# Patient Record
Sex: Female | Born: 1990 | Race: White | Hispanic: No | Marital: Single | Smoking: Never smoker
Health system: Southern US, Community
[De-identification: ages and names within clinical notes are randomized; demographics above are authoritative.]

## PROBLEM LIST (undated history)

## (undated) DIAGNOSIS — E282 Polycystic ovarian syndrome: Secondary | ICD-10-CM

## (undated) DIAGNOSIS — N200 Calculus of kidney: Secondary | ICD-10-CM

## (undated) HISTORY — PX: HAND SURGERY: SHX662

---

## 2013-02-24 ENCOUNTER — Emergency Department (HOSPITAL_BASED_OUTPATIENT_CLINIC_OR_DEPARTMENT_OTHER)
Admission: EM | Admit: 2013-02-24 | Discharge: 2013-02-24 | Disposition: A | Discharge status: Home / Self Care | Payer: Managed Care, Other (non HMO) | Attending: Emergency Medicine | Admitting: Emergency Medicine

## 2013-02-24 ENCOUNTER — Encounter (HOSPITAL_BASED_OUTPATIENT_CLINIC_OR_DEPARTMENT_OTHER): Payer: Self-pay | Admitting: Emergency Medicine

## 2013-02-24 DIAGNOSIS — Z3490 Encounter for supervision of normal pregnancy, unspecified, unspecified trimester: Secondary | ICD-10-CM

## 2013-02-24 DIAGNOSIS — N390 Urinary tract infection, site not specified: Secondary | ICD-10-CM | POA: Insufficient documentation

## 2013-02-24 DIAGNOSIS — Z87442 Personal history of urinary calculi: Secondary | ICD-10-CM | POA: Insufficient documentation

## 2013-02-24 DIAGNOSIS — Z79899 Other long term (current) drug therapy: Secondary | ICD-10-CM | POA: Insufficient documentation

## 2013-02-24 DIAGNOSIS — O2390 Unspecified genitourinary tract infection in pregnancy, unspecified trimester: Secondary | ICD-10-CM | POA: Insufficient documentation

## 2013-02-24 HISTORY — DX: Calculus of kidney: N20.0

## 2013-02-24 HISTORY — DX: Polycystic ovarian syndrome: E28.2

## 2013-02-24 LAB — WET PREP, GENITAL
Trich, Wet Prep: NONE SEEN
Yeast Wet Prep HPF POC: NONE SEEN

## 2013-02-24 LAB — HCG, QUANTITATIVE, PREGNANCY: hCG, Beta Chain, Quant, S: 16738 m[IU]/mL — ABNORMAL HIGH (ref <5)

## 2013-02-24 LAB — URINALYSIS, ROUTINE W REFLEX MICROSCOPIC
Bilirubin Urine: NEGATIVE
Glucose, UA: NEGATIVE mg/dL
Hgb urine dipstick: NEGATIVE
Specific Gravity, Urine: 1.021 (ref 1.005–1.030)

## 2013-02-24 LAB — URINE MICROSCOPIC-ADD ON

## 2013-02-24 MED ORDER — CITRANATAL RX 27-1 MG PO TABS: 1.0000 | ORAL_TABLET | Freq: Once | ORAL | Status: AC

## 2013-02-24 MED ORDER — CEPHALEXIN 500 MG PO CAPS: 500.0000 mg | ORAL_CAPSULE | Freq: Four times a day (QID) | ORAL | Status: DC

## 2013-02-24 NOTE — ED Provider Notes (Signed)
CSN: [id number]     Arrival date & time 02/24/13  1942 History   First MD Initiated Contact with Patient 02/24/13 1957     Chief Complaint  Patient presents with  . Flank Pain   (Consider location/radiation/quality/duration/timing/severity/associated sxs/prior Treatment) Patient is a 22 y.o. female presenting with cramps. The history is provided by the patient. No language interpreter was used.  Abdominal Cramping This is a new problem. Episode onset: 3 days. The problem occurs constantly. The problem has been unchanged. Associated symptoms include abdominal pain. Nothing aggravates the symptoms. She has tried nothing for the symptoms. The treatment provided moderate relief.    Past Medical History  Diagnosis Date  . Kidney stone   . Polycystic ovary disease    Past Surgical History  Procedure Laterality Date  . Hand surgery     No family history on file. History  Substance Use Topics  . Smoking status: Never Smoker   . Smokeless tobacco: Not on file  . Alcohol Use: Yes   OB History   Grav Para Term Preterm Abortions TAB SAB Ect Mult Living                 Review of Systems  Unable to perform ROS Gastrointestinal: Positive for abdominal pain.  Genitourinary: Positive for pelvic pain.    Allergies  Penicillins and Sulfa antibiotics  Home Medications   Current Outpatient Rx  Name  Route  Sig  Dispense  Refill  . metFORMIN (GLUCOPHAGE) 1000 MG tablet   Oral   Take 1,000 mg by mouth daily.          BP 123/69  Pulse 90  Temp(Src) 98.6 F (37 C) (Oral)  Resp 18  Ht 5' (1.524 m)  Wt 145 lb (65.772 kg)  BMI 28.32 kg/m2  SpO2 97% Physical Exam  Nursing note and vitals reviewed. Constitutional: She is oriented to person, place, and time. She appears well-developed and well-nourished.  HENT:  Head: Normocephalic and atraumatic.  Eyes: Conjunctivae and EOM are normal. Pupils are equal, round, and reactive to light.  Neck: Normal range of motion.   Pulmonary/Chest: Effort normal.  Abdominal: Soft. She exhibits no distension.  Genitourinary: Vagina normal. No vaginal discharge found.  Uterus enlarged  Musculoskeletal: Normal range of motion.  Neurological: She is alert and oriented to person, place, and time.  Skin: Skin is warm.  Psychiatric: She has a normal mood and affect.    ED Course  Procedures (including critical care time) Labs Review Labs Reviewed  PREGNANCY, URINE - Abnormal; Notable for the following:    Preg Test, Ur POSITIVE (*)    All other components within normal limits  URINALYSIS, ROUTINE W REFLEX MICROSCOPIC - Abnormal; Notable for the following:    APPearance CLOUDY (*)    Nitrite POSITIVE (*)    Leukocytes, UA SMALL (*)    All other components within normal limits  URINE MICROSCOPIC-ADD ON - Abnormal; Notable for the following:    Bacteria, UA FEW (*)    All other components within normal limits  URINE CULTURE  HCG, QUANTITATIVE, PREGNANCY   Imaging Review No results found.  EKG Interpretation   None       MDM   1. Pregnancy   2. UTI (lower urinary tract infection)    Informal ultrasound  IUP, good heart rate 150'.  Baby active,    Formal ultrasound scheduled   Elson Areas, PA-C 02/24/13 2139

## 2013-02-24 NOTE — ED Notes (Signed)
Pt c/o left flank pain starting today. Pt c/o pelvic pain x 2-3 days. Hx of kidney stones.

## 2013-02-24 NOTE — ED Notes (Signed)
Denies abnormal vaginal discharge.

## 2013-02-25 ENCOUNTER — Other Ambulatory Visit (HOSPITAL_BASED_OUTPATIENT_CLINIC_OR_DEPARTMENT_OTHER): Payer: Self-pay | Admitting: Physician Assistant

## 2013-02-25 ENCOUNTER — Ambulatory Visit (HOSPITAL_BASED_OUTPATIENT_CLINIC_OR_DEPARTMENT_OTHER)
Admission: RE | Admit: 2013-02-25 | Discharge: 2013-02-25 | Disposition: A | Discharge status: Home / Self Care | Payer: Managed Care, Other (non HMO) | Source: Ambulatory Visit | Attending: Family Medicine | Admitting: Family Medicine

## 2013-02-25 DIAGNOSIS — O2390 Unspecified genitourinary tract infection in pregnancy, unspecified trimester: Secondary | ICD-10-CM | POA: Diagnosis not present

## 2013-02-25 DIAGNOSIS — N390 Urinary tract infection, site not specified: Secondary | ICD-10-CM | POA: Diagnosis not present

## 2013-02-25 DIAGNOSIS — Z3490 Encounter for supervision of normal pregnancy, unspecified, unspecified trimester: Secondary | ICD-10-CM

## 2013-02-25 DIAGNOSIS — R109 Unspecified abdominal pain: Secondary | ICD-10-CM | POA: Insufficient documentation

## 2013-02-25 DIAGNOSIS — O9989 Other specified diseases and conditions complicating pregnancy, childbirth and the puerperium: Secondary | ICD-10-CM | POA: Insufficient documentation

## 2013-02-25 LAB — GC/CHLAMYDIA PROBE AMP: GC Probe RNA: NEGATIVE

## 2013-02-25 LAB — URINE CULTURE: Colony Count: >=100000

## 2013-02-25 NOTE — ED Provider Notes (Signed)
Medical screening examination/treatment/procedure(s) were performed by non-physician practitioner and as supervising physician I was immediately available for consultation/collaboration.  EKG Interpretation   None          Kimyetta Flott J. Savon Cobbs, MD 02/25/13 2136 

## 2013-03-25 ENCOUNTER — Encounter (HOSPITAL_BASED_OUTPATIENT_CLINIC_OR_DEPARTMENT_OTHER): Payer: Self-pay | Admitting: Emergency Medicine

## 2013-03-25 ENCOUNTER — Emergency Department (HOSPITAL_BASED_OUTPATIENT_CLINIC_OR_DEPARTMENT_OTHER)
Admission: EM | Admit: 2013-03-25 | Discharge: 2013-03-25 | Disposition: A | Discharge status: Home / Self Care | Payer: Managed Care, Other (non HMO) | Attending: Emergency Medicine | Admitting: Emergency Medicine

## 2013-03-25 DIAGNOSIS — Z87442 Personal history of urinary calculi: Secondary | ICD-10-CM | POA: Insufficient documentation

## 2013-03-25 DIAGNOSIS — E282 Polycystic ovarian syndrome: Secondary | ICD-10-CM | POA: Diagnosis not present

## 2013-03-25 DIAGNOSIS — N61 Mastitis without abscess: Secondary | ICD-10-CM

## 2013-03-25 DIAGNOSIS — O91119 Abscess of breast associated with pregnancy, unspecified trimester: Secondary | ICD-10-CM | POA: Insufficient documentation

## 2013-03-25 DIAGNOSIS — O9989 Other specified diseases and conditions complicating pregnancy, childbirth and the puerperium: Secondary | ICD-10-CM | POA: Diagnosis present

## 2013-03-25 DIAGNOSIS — Z79899 Other long term (current) drug therapy: Secondary | ICD-10-CM | POA: Insufficient documentation

## 2013-03-25 DIAGNOSIS — Z880 Allergy status to penicillin: Secondary | ICD-10-CM | POA: Diagnosis not present

## 2013-03-25 MED ORDER — CLINDAMYCIN HCL 150 MG PO CAPS: 300.0000 mg | ORAL_CAPSULE | Freq: Three times a day (TID) | ORAL | Status: DC

## 2013-03-25 NOTE — ED Provider Notes (Signed)
CSN: [id number]     Arrival date & time 03/25/13  2049 History   First MD Initiated Contact with Patient 03/25/13 2109     Chief Complaint  Patient presents with  . Breast Pain   (Consider location/radiation/quality/duration/timing/severity/associated sxs/prior Treatment) HPI Comments: Patient is a 23 year old G1P0 female who is [redacted]w[redacted]d pregnant who presents with a 1 week history of right breast pain. Symptoms started gradually and progressively worsened since the onset. The pain is aching and moderate without radiation. Patient reports associated redness, warmth and tenderness to palpation. Palpation of the area makes the pain worse. No alleviating factors. Patient denies nipple discharge and any other associated symptoms.    Past Medical History  Diagnosis Date  . Kidney stone   . Polycystic ovary disease    Past Surgical History  Procedure Laterality Date  . Hand surgery     History reviewed. No pertinent family history. History  Substance Use Topics  . Smoking status: Never Smoker   . Smokeless tobacco: Not on file  . Alcohol Use: Yes   OB History   Grav Para Term Preterm Abortions TAB SAB Ect Mult Living                 Review of Systems  Constitutional: Negative for fever, chills and fatigue.  HENT: Negative for trouble swallowing.   Eyes: Negative for visual disturbance.  Respiratory: Negative for shortness of breath.   Cardiovascular: Negative for chest pain and palpitations.  Gastrointestinal: Negative for nausea, vomiting, abdominal pain and diarrhea.  Genitourinary: Negative for dysuria and difficulty urinating.  Musculoskeletal: Negative for arthralgias and neck pain.  Skin: Positive for color change and rash.  Neurological: Negative for dizziness and weakness.  Psychiatric/Behavioral: Negative for dysphoric mood.    Allergies  Penicillins and Sulfa antibiotics  Home Medications   Current Outpatient Rx  Name  Route  Sig  Dispense  Refill  . cephALEXin  (KEFLEX) 500 MG capsule   Oral   Take 1 capsule (500 mg total) by mouth 4 (four) times daily.   40 capsule   0   . metFORMIN (GLUCOPHAGE) 1000 MG tablet   Oral   Take 1,000 mg by mouth daily.         . Prenat w/o A-FeCb-FeGl-DSS-FA (CITRANATAL RX) 27-1 MG TABS   Oral   Take 1 tablet by mouth once.   30 each   3    BP 135/79  Pulse 104  Temp(Src) 98.2 F (36.8 C) (Oral)  Resp 16  Ht 5\' 1"  (1.549 m)  Wt 164 lb 9 oz (74.645 kg)  BMI 31.11 kg/m2  SpO2 100% Physical Exam  Nursing note and vitals reviewed. Constitutional: She is oriented to person, place, and time. She appears well-developed and well-nourished. No distress.  HENT:  Head: Normocephalic and atraumatic.  Eyes: Conjunctivae are normal.  Neck: Normal range of motion.  Cardiovascular: Normal rate and regular rhythm.  Exam reveals no gallop and no friction rub.   No murmur heard. Pulmonary/Chest: Effort normal and breath sounds normal. She has no wheezes. She has no rales. She exhibits no tenderness.  Musculoskeletal: Normal range of motion.  Neurological: She is alert and oriented to person, place, and time. Coordination normal.  Speech is goal-oriented. Moves limbs without ataxia.   Skin: Skin is warm and dry.  Right inner breast quadrants erythematous, warm and tender to palpate. No fluctuance of the skin. The skin is indurated. No nipple discharge.   Psychiatric: She has a  normal mood and affect. Her behavior is normal.    ED Course  Procedures (including critical care time) Labs Review Labs Reviewed - No data to display Imaging Review No results found.  EKG Interpretation   None       MDM   1. Cellulitis of breast     9:26 PM Patient likely has breast cellulitis. Vitals stable and patient afebrile. Patient will be treated with Clindamycin. Patient instructed to return to the ED with worsening or concerning symptoms.    Emilia BeckKaitlyn Aloysious Vangieson, PA-C 03/25/13 2201

## 2013-03-25 NOTE — Discharge Instructions (Signed)
Take Clindamycin as directed until gone. Refer to attached documents for more information. Return to the ED or follow up with your doctor for further evaluation and management as needed.

## 2013-03-25 NOTE — ED Notes (Signed)
Pt states that her right breast has been swollen and tender, pt is [redacted]weeks pregnant and she is concerned because of redness and warm to touch. Pt denies drainage.

## 2013-03-26 NOTE — ED Provider Notes (Signed)
Medical screening examination/treatment/procedure(s) were performed by non-physician practitioner and as supervising physician I was immediately available for consultation/collaboration.  EKG Interpretation   None         Dallin Mccorkel B. Cyann Venti, MD 03/26/13 1342 

## 2013-08-25 ENCOUNTER — Ambulatory Visit (HOSPITAL_COMMUNITY): Payer: Managed Care, Other (non HMO) | Admitting: Physician Assistant

## 2014-11-21 IMAGING — US US OB LIMITED
1 series · 14 of 28 positions shown · non-contrast
Comparison: none

CLINICAL DATA: Pregnant, left flank pain, UTI

EXAM:
LIMITED OBSTETRIC ULTRASOUND

[Series 1: us ob limited · 0.30mm/px · 14 of 35 slices shown]
[im 2/35]
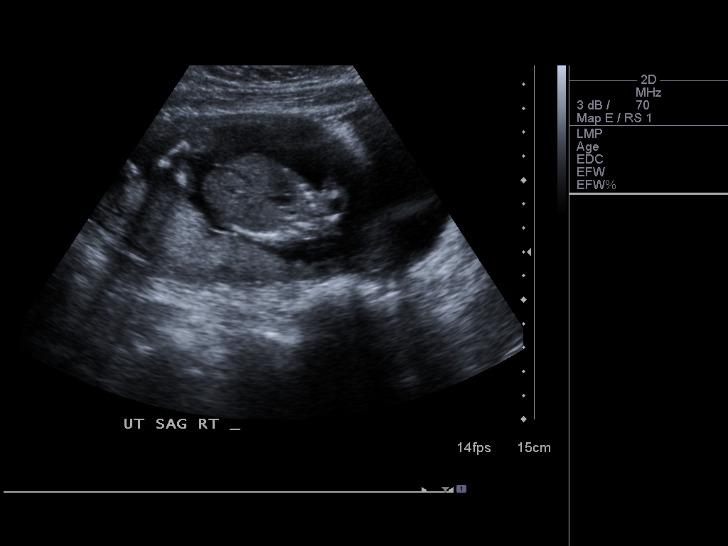
[im 4/35]
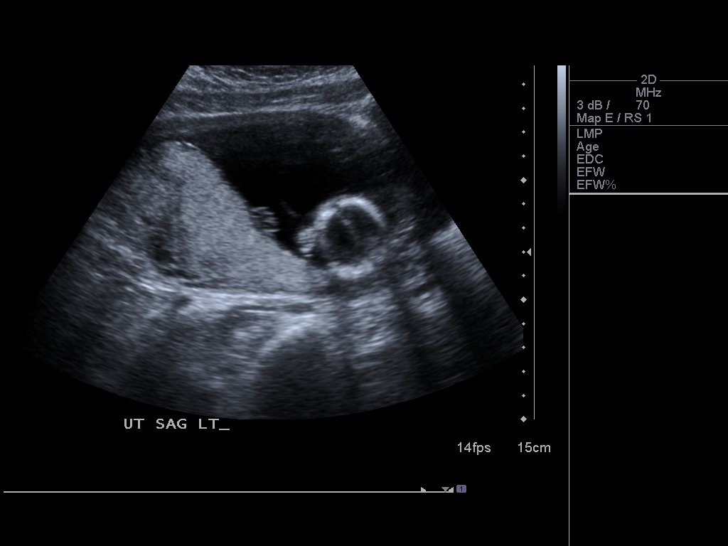
[im 7/35]
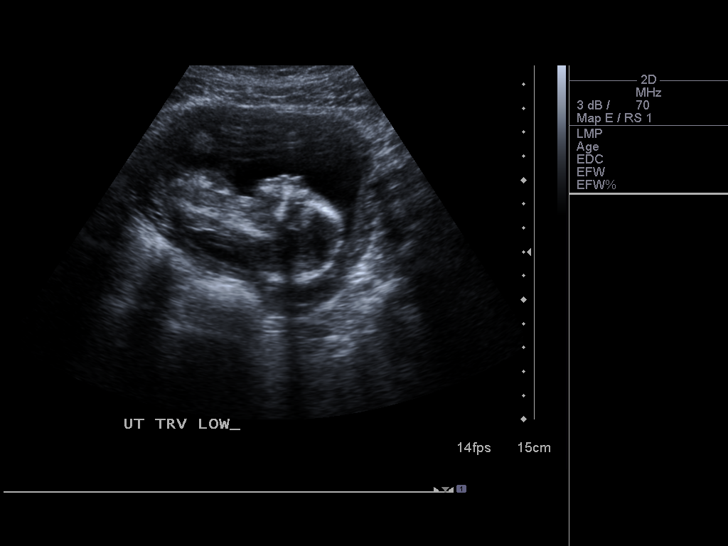
[im 9/35]
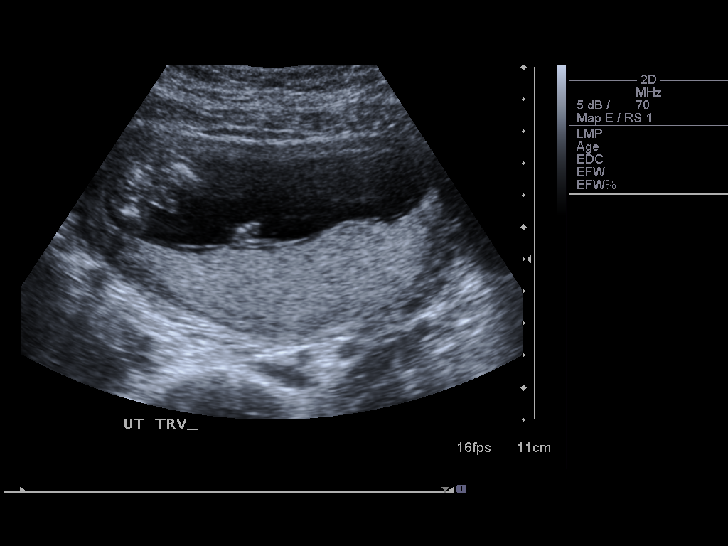
[im 12/35]
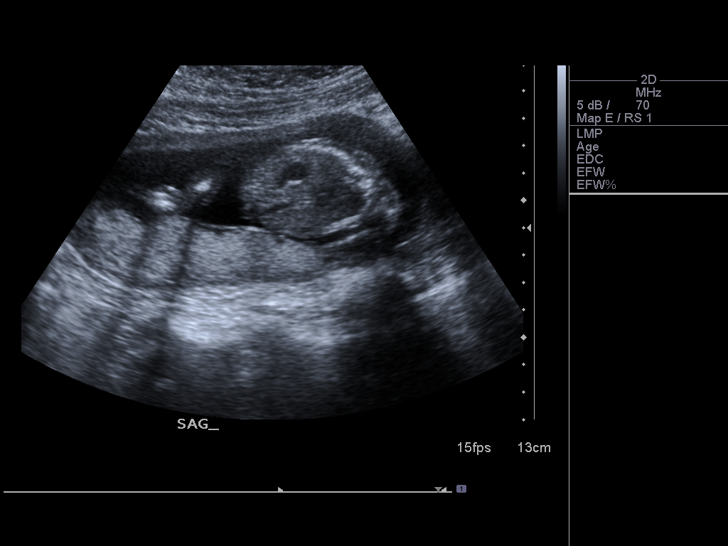
[im 14/35]
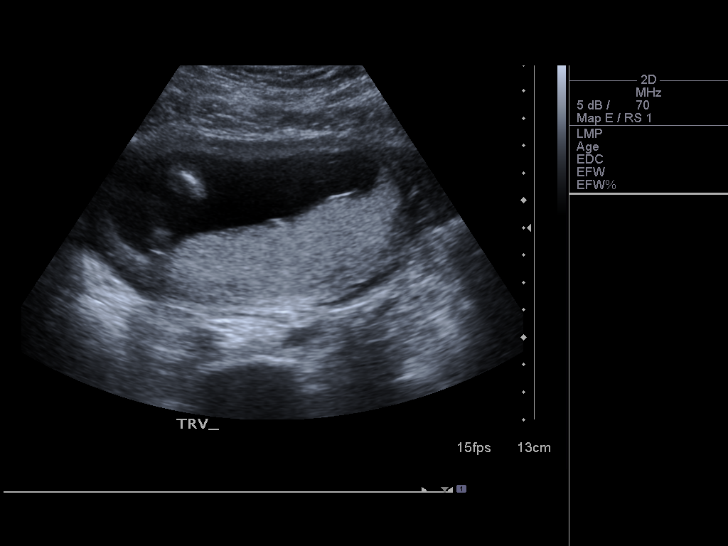
[im 17/35]
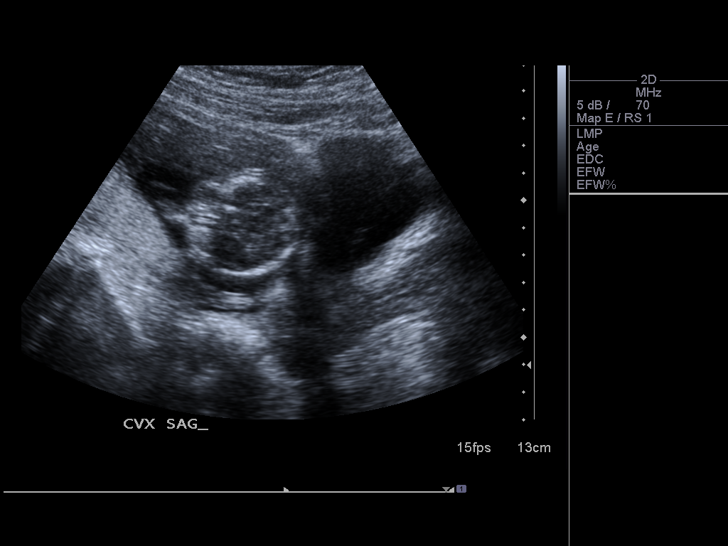
[im 19/35]
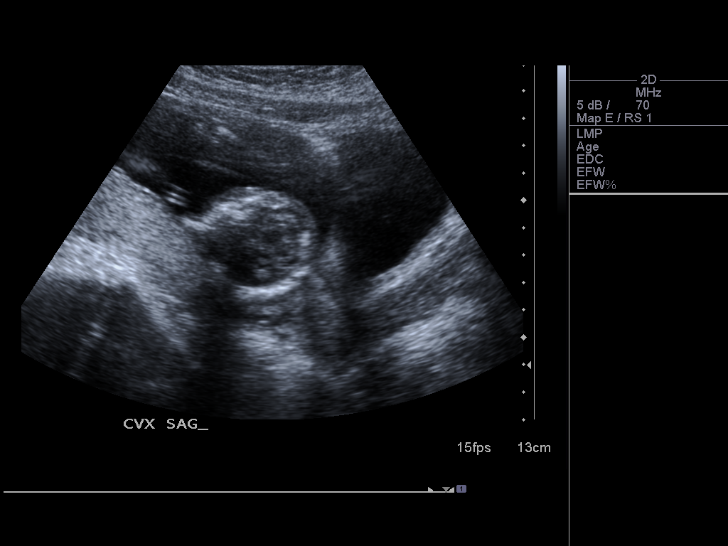
[im 22/35]
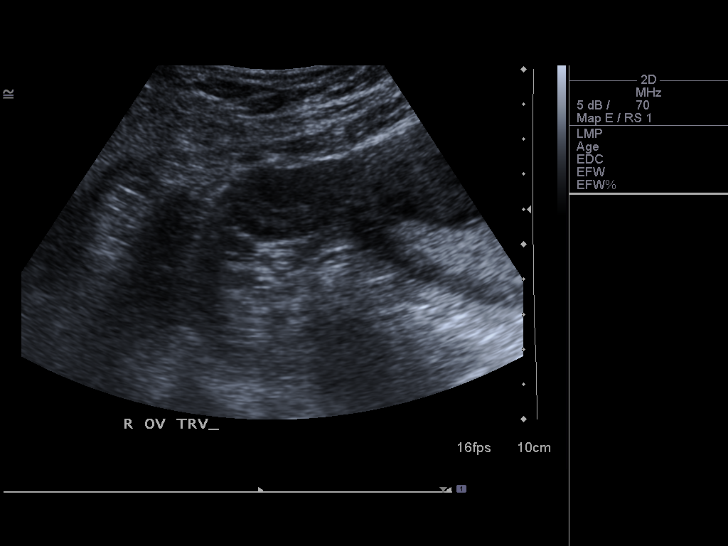
[im 24/35]
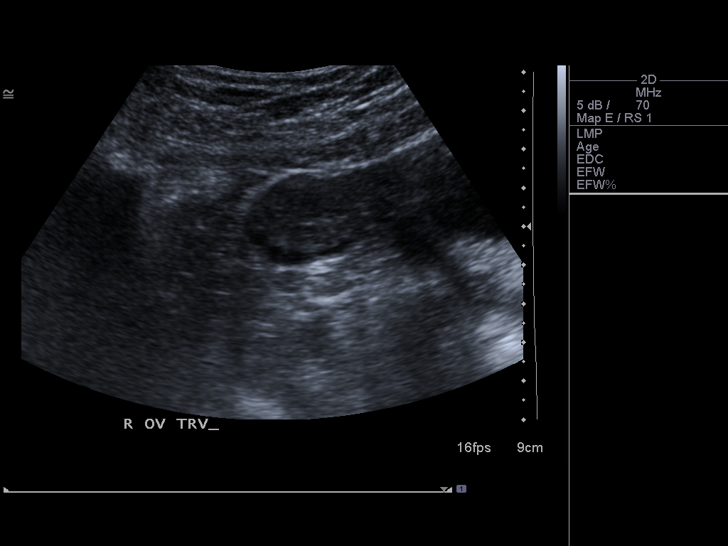
[im 27/35]
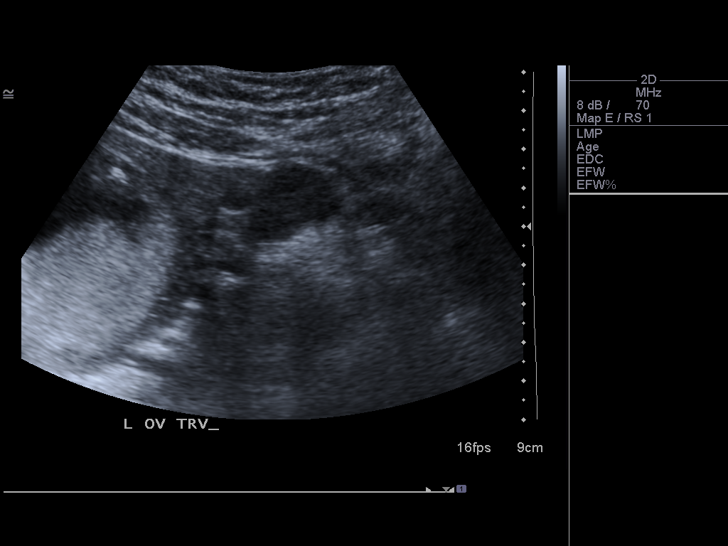
[im 29/35]
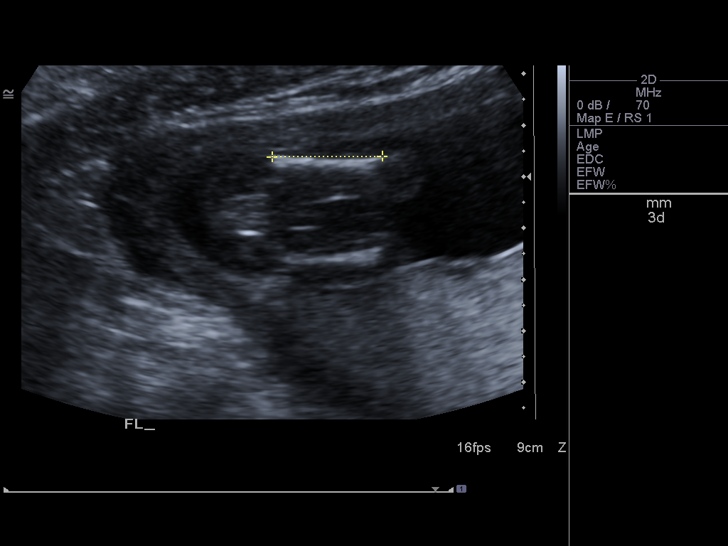
[im 32/35]
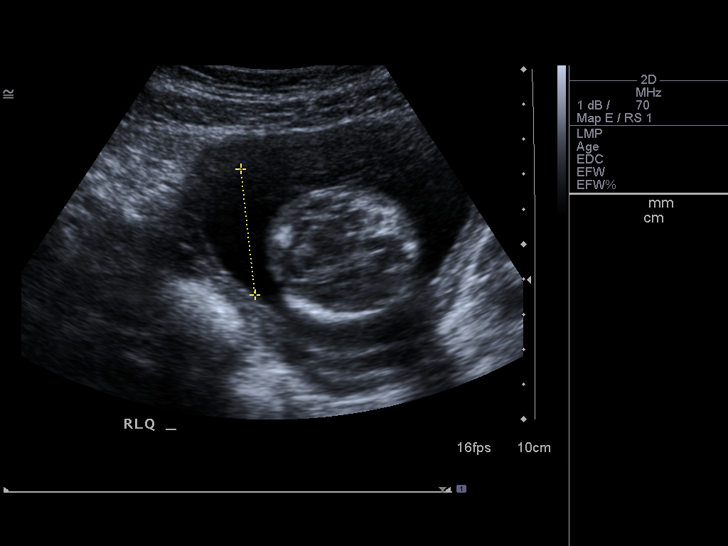
[im 35/35]
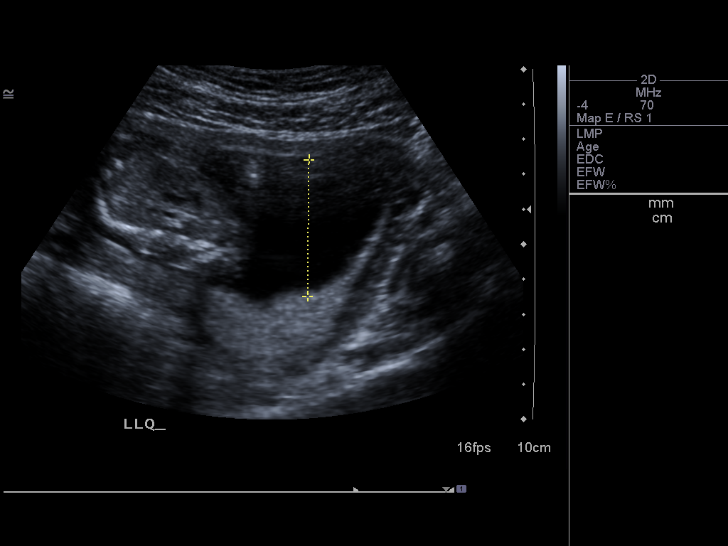

[14 of 28 positions shown; findings below may reference images not displayed]

FINDINGS: Number of Fetuses: 1

Heart Rate:  145 bpm

Movement: Yes

Presentation: Cephalic

Placental Location: Posterior

Previa: No

Amniotic Fluid (Subjective):  Within normal limits.

Femur length:  2.1cm 16w 2 d

MATERNAL FINDINGS:

Cervix:  Appears closed.

Uterus/Adnexae:  No abnormality visualized.
IMPRESSION: Single live intrauterine gestation, as described above.

This exam is performed on an emergent basis and does not
comprehensively evaluate fetal size, dating, or anatomy; follow-up
complete OB US should be considered if further fetal assessment is
warranted.

## 2016-02-29 ENCOUNTER — Emergency Department (INDEPENDENT_AMBULATORY_CARE_PROVIDER_SITE_OTHER)
Admission: EM | Admit: 2016-02-29 | Discharge: 2016-02-29 | Disposition: A | Discharge status: Home / Self Care | Payer: Managed Care, Other (non HMO) | Source: Home / Self Care | Attending: Emergency Medicine | Admitting: Emergency Medicine

## 2016-02-29 ENCOUNTER — Encounter: Payer: Self-pay | Admitting: Emergency Medicine

## 2016-02-29 DIAGNOSIS — R197 Diarrhea, unspecified: Secondary | ICD-10-CM

## 2016-02-29 DIAGNOSIS — K2900 Acute gastritis without bleeding: Secondary | ICD-10-CM

## 2016-02-29 DIAGNOSIS — R112 Nausea with vomiting, unspecified: Secondary | ICD-10-CM

## 2016-02-29 LAB — POCT CBC W AUTO DIFF (K'VILLE URGENT CARE)

## 2016-02-29 MED ORDER — ONDANSETRON 4 MG PO TBDP: 4.0000 mg | ORAL_TABLET | Freq: Three times a day (TID) | ORAL | 0 refills | Status: AC | PRN

## 2016-02-29 MED ORDER — GI COCKTAIL ~~LOC~~
30.0000 mL | Freq: Once | ORAL | Status: AC
  Administered 2016-02-29: 30 mL via ORAL

## 2016-02-29 MED ORDER — HYOSCYAMINE SULFATE 0.125 MG PO TABS: ORAL_TABLET | ORAL | 0 refills | Status: AC

## 2016-02-29 MED ORDER — PANTOPRAZOLE SODIUM 20 MG PO TBEC: 20.0000 mg | DELAYED_RELEASE_TABLET | Freq: Two times a day (BID) | ORAL | 1 refills | Status: AC

## 2016-02-29 NOTE — ED Provider Notes (Signed)
[facility]    CSN: [id number] Arrival date & time: 02/29/16  1500     History   Chief Complaint Chief Complaint  Patient presents with  . Abdominal Pain    HPI Kathryn Casey is a 25 y.o. female.   The history is provided by the patient.  Abdominal Pain  Pain severity:  Moderate 25 year old female here with another adult friend. 2 weeks of mild to moderate epigastric and left upper quadrant pain, feels burning like acid reflux, associated with burping. No radiation. No right upper quadrant pain or any mid or lower abdominal or pelvic pain. The epigastric pain episodes occur intermittently, worsened by spicy foods and [place] foods. No definite fever or chills.No others sickhat she's been around. No foreign travel. He drinks city water. The episodes are associated with nausea, vomited times one today without any blood. Episodes associated with loose watery stools, green color without any blood or melena or any definite mucus. Prior to 2 weeks ago, she has not had these symptoms chronically. History of PCO S, but she denies any pelvic symptoms currently. 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 Denies any GU symptoms. No dysuria or frequency or hematuria. No associated back pain, chest pain or shortness of breath or ENT symptoms. Denies fever, chills, swollen joints, rash, tick bite Currently, intensity of the epigastric pain is mild but can be moderate when episode worsens. She is tolerating by mouth clear liquids today. She has not tried any medication for this Denies using alcohol or tobacco or drugs.   Past Medical History:  Diagnosis Date  . Kidney stone   . Polycystic ovary disease   she states she's never had a GI diagnosis in the past.    Past Surgical History:  Procedure Laterality Date  . HAND SURGERY    ablation uterus for PCOS  OB History    No data available       Home  Medications    Prior to Admission medications   Medication Sig Start Date End Date Taking? Authorizing Provider  hyoscyamine (LEVSIN, ANASPAZ) 0.125 MG tablet Take one by mouth every 4-6 hours as needed for abdominal cramping or pain or diarrhea 02/29/16   Lajean Manesavid Renea Schoonmaker, MD  metFORMIN (GLUCOPHAGE) 1000 MG tablet Take 1,000 mg by mouth daily.    Historical Provider, MD  ondansetron (ZOFRAN-ODT) 4 MG disintegrating tablet Take 1 tablet (4 mg total) by mouth every 8 (eight) hours as needed for nausea or vomiting. 02/29/16   Lajean Manesavid Cristal Qadir, MD  pantoprazole (PROTONIX) 20 MG tablet Take 1 tablet (20 mg total) by mouth 2 (two) times daily before a meal. For 10 days 02/29/16   Lajean Manesavid Sahiba Granholm, MD  Prenat w/o A-FeCb-FeGl-DSS-FA (CITRANATAL RX) 27-1 MG TABS Take 1 tablet by mouth once. 02/24/13   Elson AreasLeslie K Sofia, PA-C    Family History History reviewed. No pertinent family history. Family history: Denies family history of chronic GI disease. Social History Social History  Substance Use Topics  . Smoking status: Never Smoker  . Smokeless tobacco: Never Used  . Alcohol use Yes     Allergies   Penicillins and Sulfa antibiotics   Review of Systems Review of Systems  Gastrointestinal: Positive for abdominal pain.  All other systems reviewed and are negative. see also history of present illness above   Physical Exam Triage Vital Signs ED Triage Vitals  Enc Vitals Group  BP      Pulse      Resp      Temp      Temp src      SpO2      Weight      Height      Head Circumference      Peak Flow      Pain Score      Pain Loc      Pain Edu?      Excl. in GC?    No data found.   Updated Vital Signs BP (!) 107/51 (BP Location: Right Arm)   Pulse 106   Temp 98.5 F (36.9 C) (Oral)   Wt 210 lb (95.3 kg)   SpO2 96%   BMI 39.68 kg/m  P repeated 84  Physical Exam  Constitutional: She is oriented to person, place, and time. She appears well-developed and well-nourished.  Non-toxic  appearance. No distress.  Appears fatigued, but no acute distress. Pleasant, cooperative.  HENT:  Head: Normocephalic and atraumatic.  Nose: Nose normal.  Mouth/Throat: Oropharynx is clear and moist.  Oropharynx clear. Some moisture on mucous membranes. No lesions.  Eyes: Pupils are equal, round, and reactive to light. No scleral icterus.  Neck: Normal range of motion. Neck supple. No JVD present.  Cardiovascular: Normal rate, regular rhythm and normal heart sounds.   No murmur heard. Pulmonary/Chest: Effort normal and breath sounds normal.  Abdominal: Soft. She exhibits no distension, no abdominal bruit and no mass. Bowel sounds are increased. There is no hepatosplenomegaly. There is tenderness (Minimal tenderness epigastric area. No other tenderness.). There is no rebound, no guarding, no CVA tenderness, no tenderness at McBurney's point and negative Murphy's sign. No hernia.  Musculoskeletal: Normal range of motion. She exhibits no edema or tenderness.  Lymphadenopathy:    She has no cervical adenopathy.  Neurological: She is alert and oriented to person, place, and time. No cranial nerve deficit.  Skin: Capillary refill takes less than 2 seconds. No rash noted.  Psychiatric: She has a normal mood and affect.  Nursing note and vitals reviewed.    UC Treatments / Results  Labs (all labs ordered are listed, but only abnormal results are displayed) Labs Reviewed  POCT CBC W AUTO DIFF ([facility])    EKG  EKG Interpretation None       Radiology No results found.  Procedures Procedures (including critical care time)  Medications Ordered in UC Medications  gi cocktail (Maalox,Lidocaine,Donnatal) (30 mLs Oral Given 02/29/16 1550)     Initial Impression / Assessment and Plan / UC Course  I have reviewed the triage vital signs and the nursing notes.  Pertinent labs & imaging results that were available during my care of the patient were reviewed by me and  considered in my medical decision making (see chart for details).  Clinical Course as of Mar 01 1703  Fri Feb 29, 2016  1548 History obtained, physical exam done. Ordered CBC and GI cocktail  [DM]  1551 CBC, WBC upper limits of normal 11.3, hemoglobin 14.0  [DM]    Clinical Course User Index [DM] Lajean Manes, MD    Reviewed with patient that CBC within normal limits, no worrisome findings. A few minutes after she was given GI cocktail, she reported that feeling of heartburn and epigastric pain improved. She was also able to tolerate 4 ounces of water here in urgent care, without any problems. I explained that she has no sign of acute surgical abdomen.  Clinically no evidence of gallbladder or appendicitis as cause for her symptoms. Abdominal exam is benign with hyperactive bowel sounds. She probably has a combination of gastritis with GERD, and may have gastroenteritis from dietary indiscretion or viral cause.  Final Clinical Impressions(s) / UC Diagnoses   Final diagnoses:  Other acute gastritis without hemorrhage  Nausea vomiting and diarrhea  Treatment options discussed, as well as risks, benefits, alternatives. Patient voiced understanding and agreement with the following plans:   New Prescriptions Discharge Medication List as of 02/29/2016  4:05 PM    START taking these medications   Details  hyoscyamine (LEVSIN, ANASPAZ) 0.125 MG tablet Take one by mouth every 4-6 hours as needed for abdominal cramping or pain or diarrhea, Normal    ondansetron (ZOFRAN-ODT) 4 MG disintegrating tablet Take 1 tablet (4 mg total) by mouth every 8 (eight) hours as needed for nausea or vomiting., Starting Fri 02/29/2016, Normal    pantoprazole (PROTONIX) 20 MG tablet Take 1 tablet (20 mg total) by mouth 2 (two) times daily before a meal. For 10 days, Starting Fri 02/29/2016, Normal      Follow-up with your primary care doctor in 5-7 days if not improving, or sooner if symptoms become worse. I  explained that even if she is feeling better from today's treatment plan, she should follow up with her PCP within 2 weeks. Precautions discussed. An After Visit Summary was printed and given to the patient. This included explanation of possible diagnoses, as well as diet and other nonpharmacologic measures. Red flags discussed. Questions invited and answered. Patient voiced understanding and agreement.    Lajean Manesavid Torah Pinnock, MD 02/29/16 [phone]

## 2016-02-29 NOTE — Discharge Instructions (Signed)
Take medications as prescribed. See instruction sheets, attached. Drink plenty of clear liquids.Avoid greasy or spicy foods, see list attached. If any severe or worsening symptoms, go to emergency room immediately

## 2016-02-29 NOTE — ED Triage Notes (Signed)
Pt c/o upper abdominal pain x2 weeks. Having intermittent N+V+D. No appetite and indigestion.

## 2016-03-03 ENCOUNTER — Telehealth: Payer: Self-pay | Admitting: *Deleted

## 2016-03-03 NOTE — Telephone Encounter (Signed)
Callback: No answer, LMOM f/u from visit. Reminded to f/u with PCP if not improving. Call back as needed.

## 2019-06-23 ENCOUNTER — Other Ambulatory Visit: Payer: Self-pay | Admitting: Sports Medicine

## 2019-06-23 DIAGNOSIS — S83105A Unspecified dislocation of left knee, initial encounter: Secondary | ICD-10-CM

## 2019-06-23 DIAGNOSIS — M25462 Effusion, left knee: Secondary | ICD-10-CM
